# Patient Record
Sex: Female | Born: 1955 | Race: White | Hispanic: No | Marital: Married | Smoking: Never smoker
Health system: Southern US, Community
[De-identification: ages and names within clinical notes are randomized; demographics above are authoritative.]

## PROBLEM LIST (undated history)

## (undated) DIAGNOSIS — E079 Disorder of thyroid, unspecified: Secondary | ICD-10-CM

## (undated) HISTORY — DX: Disorder of thyroid, unspecified: E07.9

## (undated) HISTORY — PX: POLYPECTOMY: SHX149

---

## 1997-09-01 ENCOUNTER — Inpatient Hospital Stay (HOSPITAL_COMMUNITY): Admission: AD | Admit: 1997-09-01 | Discharge: 1997-09-01 | Payer: Self-pay | Admitting: Obstetrics & Gynecology

## 1997-09-19 ENCOUNTER — Encounter (HOSPITAL_COMMUNITY): Admission: RE | Admit: 1997-09-19 | Discharge: 1997-12-18 | Payer: Self-pay | Admitting: *Deleted

## 1997-10-14 ENCOUNTER — Encounter: Payer: Self-pay | Admitting: *Deleted

## 1997-11-24 ENCOUNTER — Observation Stay (HOSPITAL_COMMUNITY): Admission: AD | Admit: 1997-11-24 | Discharge: 1997-11-25 | Payer: Self-pay | Admitting: Obstetrics & Gynecology

## 1997-11-24 ENCOUNTER — Encounter: Payer: Self-pay | Admitting: Emergency Medicine

## 1997-11-24 ENCOUNTER — Emergency Department (HOSPITAL_COMMUNITY): Admission: EM | Admit: 1997-11-24 | Discharge: 1997-11-24 | Payer: Self-pay | Admitting: Emergency Medicine

## 1997-11-25 ENCOUNTER — Encounter: Payer: Self-pay | Admitting: Obstetrics & Gynecology

## 1997-12-23 ENCOUNTER — Encounter: Payer: Self-pay | Admitting: *Deleted

## 1997-12-23 ENCOUNTER — Encounter (HOSPITAL_COMMUNITY): Admission: RE | Admit: 1997-12-23 | Discharge: 1998-03-17 | Payer: Self-pay | Admitting: *Deleted

## 1997-12-25 ENCOUNTER — Ambulatory Visit (HOSPITAL_COMMUNITY): Admission: RE | Admit: 1997-12-25 | Discharge: 1997-12-25 | Payer: Self-pay | Admitting: *Deleted

## 1997-12-26 ENCOUNTER — Inpatient Hospital Stay (HOSPITAL_COMMUNITY): Admission: AD | Admit: 1997-12-26 | Discharge: 1997-12-26 | Payer: Self-pay | Admitting: Radiology

## 1998-03-06 ENCOUNTER — Encounter: Payer: Self-pay | Admitting: *Deleted

## 1998-03-17 ENCOUNTER — Inpatient Hospital Stay (HOSPITAL_COMMUNITY): Admission: AD | Admit: 1998-03-17 | Discharge: 1998-03-20 | Payer: Self-pay | Admitting: *Deleted

## 1998-03-20 ENCOUNTER — Encounter (HOSPITAL_COMMUNITY): Admission: RE | Admit: 1998-03-20 | Discharge: 1998-06-18 | Payer: Self-pay | Admitting: *Deleted

## 1998-04-28 ENCOUNTER — Inpatient Hospital Stay (HOSPITAL_COMMUNITY): Admission: AD | Admit: 1998-04-28 | Discharge: 1998-04-28 | Payer: Self-pay | Admitting: *Deleted

## 1998-06-22 ENCOUNTER — Encounter (HOSPITAL_COMMUNITY): Admission: RE | Admit: 1998-06-22 | Discharge: 1998-09-20 | Payer: Self-pay | Admitting: *Deleted

## 2001-01-29 ENCOUNTER — Other Ambulatory Visit: Admission: RE | Admit: 2001-01-29 | Discharge: 2001-01-29 | Payer: Self-pay | Admitting: Internal Medicine

## 2002-05-22 ENCOUNTER — Other Ambulatory Visit: Admission: RE | Admit: 2002-05-22 | Discharge: 2002-05-22 | Payer: Self-pay | Admitting: Obstetrics & Gynecology

## 2004-02-12 ENCOUNTER — Other Ambulatory Visit: Admission: RE | Admit: 2004-02-12 | Discharge: 2004-02-12 | Payer: Self-pay | Admitting: Obstetrics & Gynecology

## 2004-05-11 ENCOUNTER — Ambulatory Visit: Payer: Self-pay | Admitting: Internal Medicine

## 2004-12-08 ENCOUNTER — Ambulatory Visit: Payer: Self-pay | Admitting: Internal Medicine

## 2005-03-16 ENCOUNTER — Other Ambulatory Visit: Admission: RE | Admit: 2005-03-16 | Discharge: 2005-03-16 | Payer: Self-pay | Admitting: Obstetrics & Gynecology

## 2005-12-30 ENCOUNTER — Ambulatory Visit: Payer: Self-pay | Admitting: Family Medicine

## 2008-11-11 ENCOUNTER — Encounter (INDEPENDENT_AMBULATORY_CARE_PROVIDER_SITE_OTHER): Payer: Self-pay | Admitting: *Deleted

## 2008-12-23 ENCOUNTER — Encounter (INDEPENDENT_AMBULATORY_CARE_PROVIDER_SITE_OTHER): Payer: Self-pay | Admitting: *Deleted

## 2008-12-29 ENCOUNTER — Ambulatory Visit: Payer: Self-pay | Admitting: Internal Medicine

## 2009-01-05 ENCOUNTER — Telehealth: Payer: Self-pay | Admitting: Internal Medicine

## 2009-01-07 ENCOUNTER — Ambulatory Visit: Payer: Self-pay | Admitting: Internal Medicine

## 2009-01-08 ENCOUNTER — Encounter: Payer: Self-pay | Admitting: Internal Medicine

## 2010-02-04 NOTE — Letter (Signed)
Summary: Patient Notice- Polyp Results  [facility]  [address] [place], [place] [address]   Phone: [phone]  Fax: [phone]        January 08, 2009 MRN: [id number]    [facility] [address] [place], [place]  [address]    Dear Karen Kitchens,  I am pleased to inform you that the colon polyps removed during your recent colonoscopy were found to be benign (no cancer detected) upon pathologic examination.  As we discussed,I recommend you have a repeat colonoscopy examination in 3 years to look for recurrent polyps, as having colon polyps increases your risk for having recurrent polyps or even colon cancer in the future.  Should you develop new or worsening symptoms of abdominal pain, bowel habit changes or bleeding from the rectum or bowels, please schedule an evaluation with either your primary care physician or with me.  Additional information/recommendations:  __ No further action with gastroenterology is needed at this time. Please      follow-up with your primary care physician for your other healthcare      needs.   Please call us if you are having persistent problems or have questions about your condition that have not been fully answered at this time.  Sincerely,  Wilhemina Bonito. Eda Keys., MD [facility]   This letter has been electronically signed by your physician.  Appended Document: Patient Notice- Polyp Results Letter mailed 01.13.11

## 2010-02-04 NOTE — Procedures (Signed)
Summary: Colonoscopy  Patient: [name] Note: All result statuses are Final unless otherwise noted.  Tests: (1) Colonoscopy (COL)   COL Colonoscopy           DONE     [facility]     [address]     [place], [place]  [address]           COLONOSCOPY PROCEDURE REPORT           PATIENT:  Morgan Parks, Morgan Parks  MR#:  [id number]     BIRTHDATE:  [birth year], 53 yrs. old  GENDER:  female           ENDOSCOPIST:  Wilhemina Bonito. Eda Keys, MD     Referred by:  .Direct / Self           PROCEDURE DATE:  01/07/2009     PROCEDURE:  Colonoscopy with snare polypectomy     ASA CLASS:  Class I     INDICATIONS:  Routine Risk Screening           MEDICATIONS:   Fentanyl 75 mcg IV, Versed 9 mg IV           DESCRIPTION OF PROCEDURE:   After the risks benefits and     alternatives of the procedure were thoroughly explained, informed     consent was obtained.  Digital rectal exam was performed and     revealed no abnormalities.   The LB CF-H180AL [id number] endoscope     was introduced through the anus and advanced to the cecum, which     was identified by both the appendix and ileocecal valve, without     limitations.Time to cecum = 6:05 min. The quality of the prep was     excellent, using MoviPrep.  The instrument was then withdrawn     (time = 17:47 min) as the colon was fully examined.     <<PROCEDUREIMAGES>>           FINDINGS:  There were multiple polyps (6) measuring between 3mm     and 5mm that were identified and removed in the mid transverse     colon (2), sigmoid colon (2), and rectum (2). Polyps were snared     without cautery. Retrieval was successful. snare polyp  This was     otherwise a normal examination of the colon.   Retroflexed views     in the rectum revealed no abnormalities.    The scope was then     withdrawn from the patient and the procedure completed.           COMPLICATIONS:  None           ENDOSCOPIC IMPRESSION:     1) Polyps (6)in the colon - Removed     2)  Otherwise normal examination           RECOMMENDATIONS:     1) Follow up colonoscopy in 3 years           ______________________________     Wilhemina Bonito. Eda Keys, MD           CC:  Varney Baas, MD;Bruce Romilda Garret, MD;The Patient           n.     Rosalie DoctorWilhemina Bonito. Eda Keys at 01/07/2009 01:13 PM           Alma Downs, [id number]  Note: An exclamation mark (!) indicates a result that was not dispersed into the flowsheet. Document  Creation Date: 01/07/2009 1:12 PM _______________________________________________________________________  (1) Order result status: Final Collection or observation date-time: 01/07/2009 13:02 Requested date-time:  Receipt date-time:  Reported date-time:  Referring Physician:   Ordering Physician: Fransico Setters [phone]) Specimen Source:  Source: Launa Grill Order Number: [id number] Lab site:   Appended Document: Colonoscopy     Procedures Next Due Date:    Colonoscopy: 01/2012

## 2010-02-04 NOTE — Progress Notes (Signed)
Summary: ? re prep   Phone Note Call from Patient Call back at Home Phone [phone]   Caller: Patient Call For: Marina Goodell Reason for Call: Talk to Nurse Summary of Call: Patient wants to speak to nurse regarding prep instructions. Initial call taken by: Tawni Levy,  January 05, 2009 10:24 AM  Follow-up for Phone Call        Spoke with patient about prep and the new times for prep because she said she moved her procedure day to wed. answered all questions from patient at this time. Follow-up by: Sherren Kerns RN,  January 05, 2009 11:56 AM

## 2011-12-14 ENCOUNTER — Ambulatory Visit
Admission: RE | Admit: 2011-12-14 | Discharge: 2011-12-14 | Disposition: A | Discharge status: Home / Self Care | Payer: BC Managed Care – PPO | Source: Ambulatory Visit | Attending: Obstetrics & Gynecology | Admitting: Obstetrics & Gynecology

## 2011-12-14 ENCOUNTER — Other Ambulatory Visit: Payer: Self-pay | Admitting: Obstetrics & Gynecology

## 2011-12-14 DIAGNOSIS — N63 Unspecified lump in unspecified breast: Secondary | ICD-10-CM

## 2011-12-14 IMAGING — MG MM DIGITAL DIAGNOSTIC UNILAT*L*
3 series · 3 of 3 positions shown · non-contrast
Comparison: [DATE], [DATE], [DATE], [DATE].

CLINICAL DATA: The patient notes a tender palpable mass within the
left breast at the 12 o'clock position 1 cm superior to the nipple.
There is no family history of breast cancer.

DIGITAL DIAGNOSTIC LEFT BREAST MAMMOGRAM WITH CAD AND LEFT BREAST
ULTRASOUND:

[L CC]
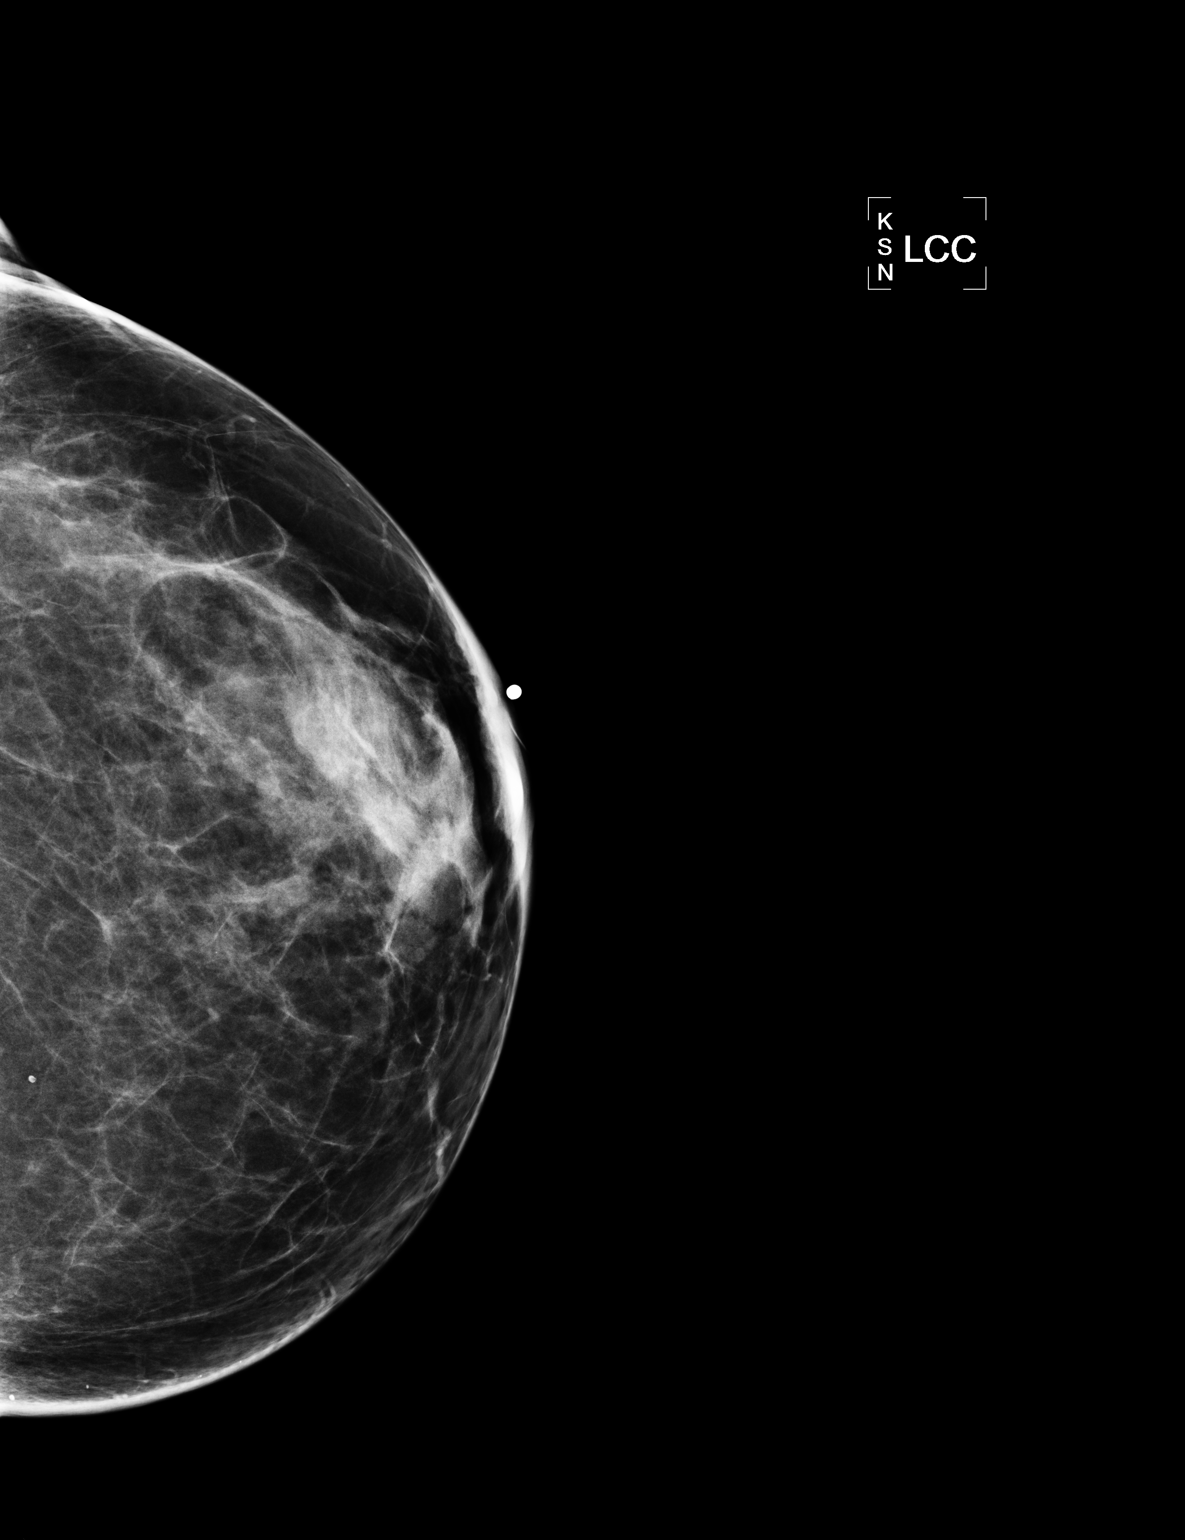

[L MLO]
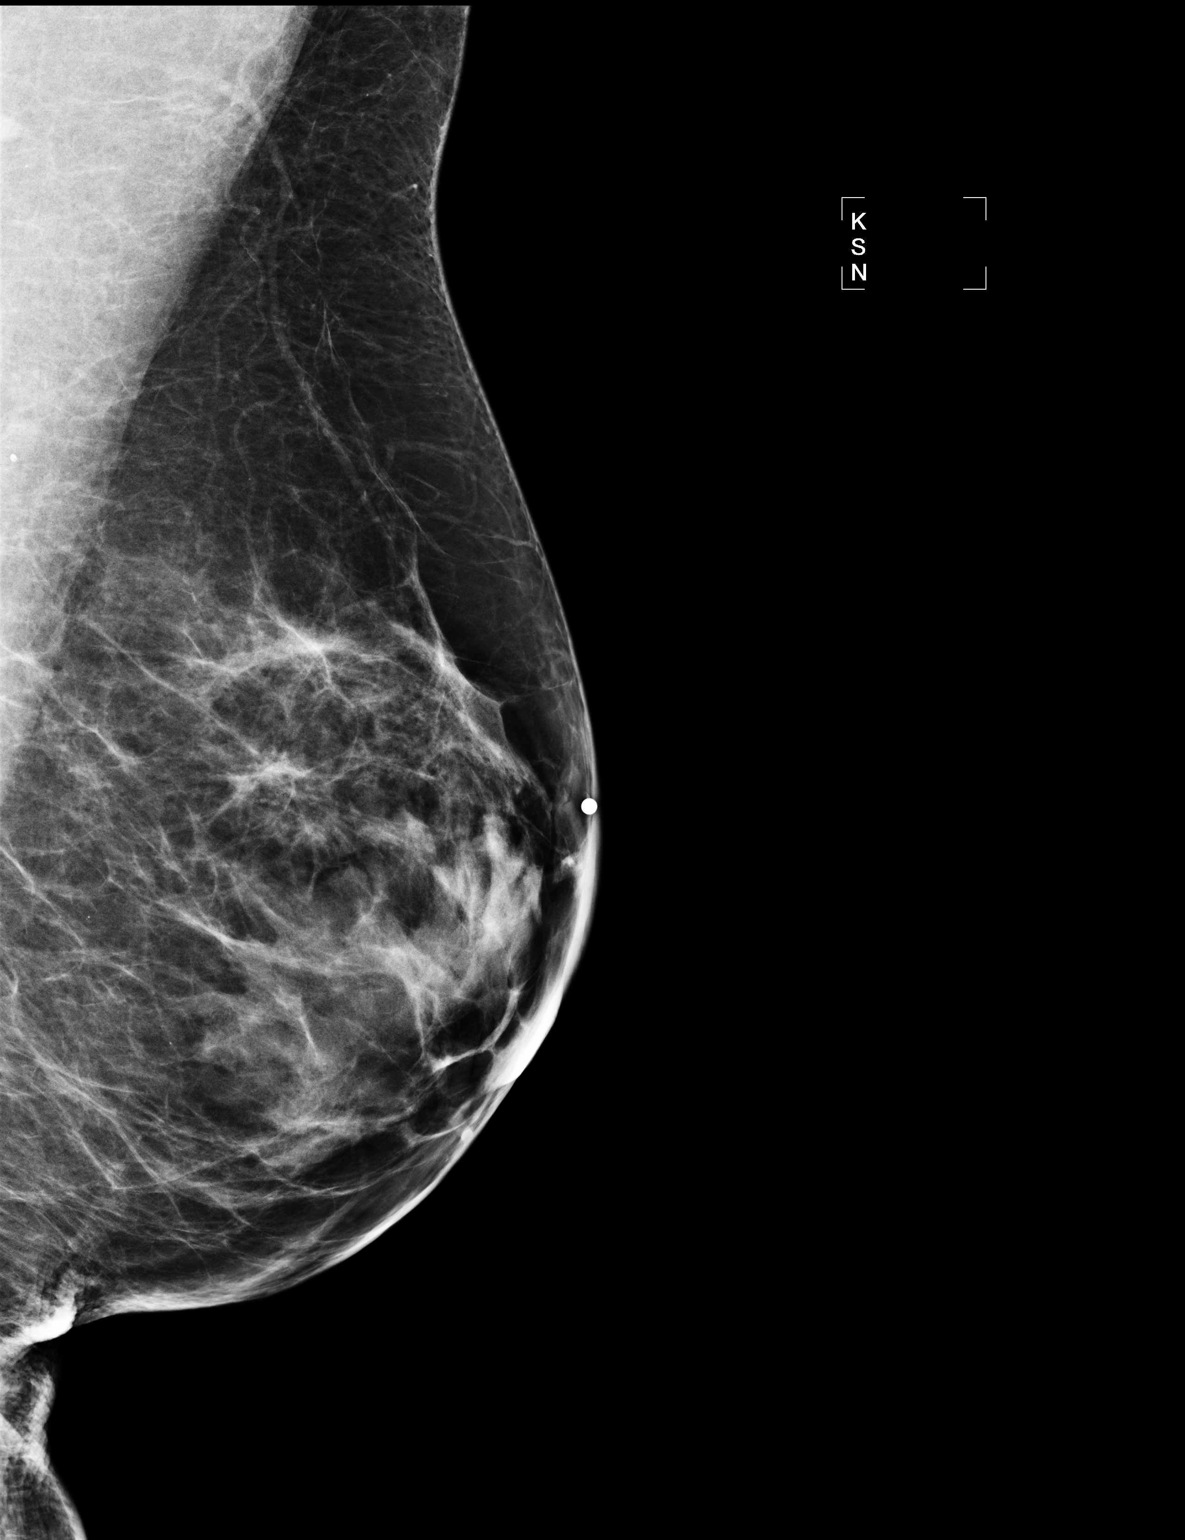

[L TAN]
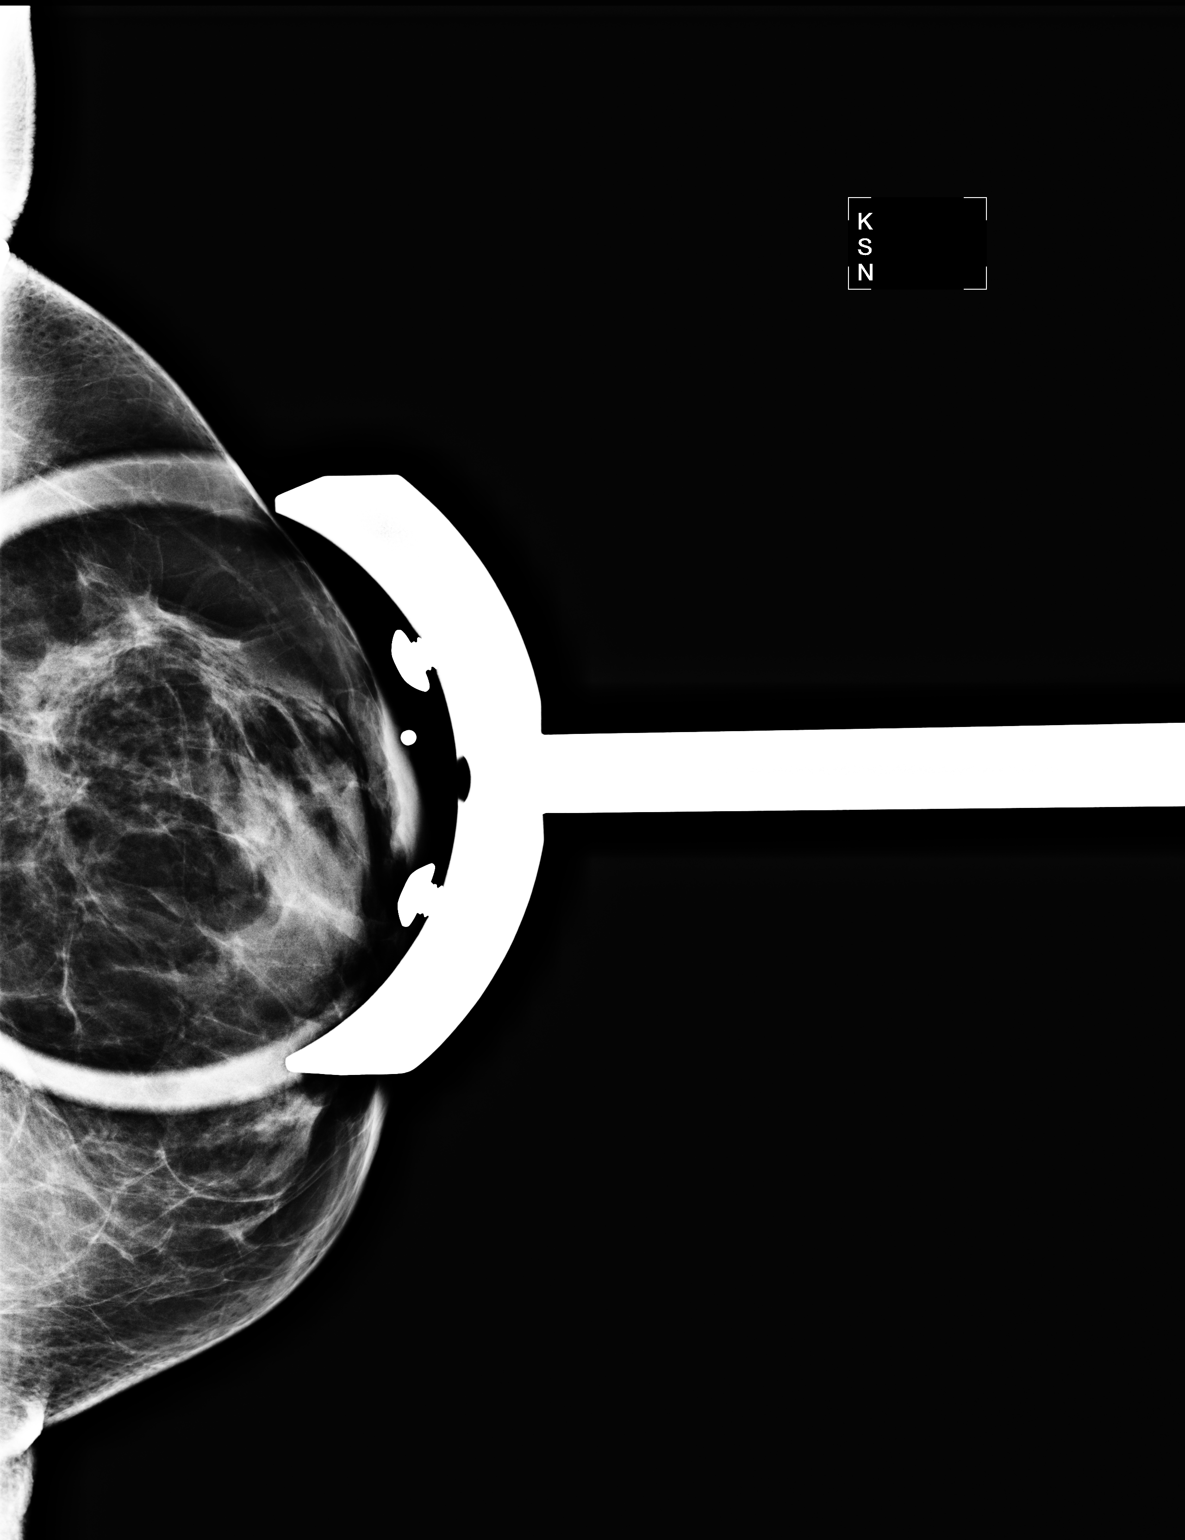

[3 of 3 positions shown; findings below may reference images not displayed]

FINDINGS: ACR Breast Density Category 3: The breast tissue is heterogeneously
dense.

There is slight thickening of the skin within the superior
periareolar portion of the left breast.  There is no mass,
distortion, or worrisome calcification.

Mammographic images were processed with CAD.

On physical exam, there is a palpable cord - like mass located
within the left breast at the 1 o'clock position 2 cm from nipple
which measures approximately 2 cm in length.  This is mildly
tender.  There are no overlying skin changes.

Ultrasound is performed, showing a sonolucent tubular structure
located in a sub dermal location within the left breast at the 1
o'clock position 2 cm from the nipple corresponding to the palpable
finding.  This is most consistent with superficial
thrombophlebitis (Mondor's disease ). There are no additional
findings.
IMPRESSION: Findings are most consistent with Mondor's  disease within the left
breast located at the 1 o'clock position (periareolar location)
accounting for the palpable finding.  Recommend ibuprofen, warm
compresses, and follow-up ultrasound evaluation in 3-4 weeks.

RECOMMENDATION:
Follow-up left breast ultrasound in 3-4 weeks.

I have discussed the findings and recommendations with the patient.
Results were also provided in writing at the conclusion of the
visit.

BI-RADS CATEGORY 3:  Probably benign finding(s) - short interval
follow-up suggested.

## 2012-01-11 ENCOUNTER — Encounter: Payer: Self-pay | Admitting: Internal Medicine

## 2012-01-11 ENCOUNTER — Ambulatory Visit
Admission: RE | Admit: 2012-01-11 | Discharge: 2012-01-11 | Disposition: A | Discharge status: Home / Self Care | Payer: BC Managed Care – PPO | Source: Ambulatory Visit | Attending: Obstetrics & Gynecology | Admitting: Obstetrics & Gynecology

## 2012-01-11 DIAGNOSIS — N63 Unspecified lump in unspecified breast: Secondary | ICD-10-CM

## 2012-03-26 ENCOUNTER — Encounter: Payer: Self-pay | Admitting: Internal Medicine

## 2012-03-26 ENCOUNTER — Other Ambulatory Visit: Payer: Self-pay

## 2012-03-26 DIAGNOSIS — Z1231 Encounter for screening mammogram for malignant neoplasm of breast: Secondary | ICD-10-CM

## 2012-05-01 ENCOUNTER — Ambulatory Visit (AMBULATORY_SURGERY_CENTER): Payer: BC Managed Care – PPO

## 2012-05-01 ENCOUNTER — Encounter: Payer: Self-pay | Admitting: Internal Medicine

## 2012-05-01 VITALS — Ht 66.0 in | Wt 130.4 lb

## 2012-05-01 DIAGNOSIS — Z86010 Personal history of colonic polyps: Secondary | ICD-10-CM

## 2012-05-01 DIAGNOSIS — Z1211 Encounter for screening for malignant neoplasm of colon: Secondary | ICD-10-CM

## 2012-05-01 DIAGNOSIS — Z8371 Family history of colonic polyps: Secondary | ICD-10-CM

## 2012-05-01 MED ORDER — MOVIPREP 100 G PO SOLR: ORAL | Status: DC

## 2012-05-09 ENCOUNTER — Ambulatory Visit
Admission: RE | Admit: 2012-05-09 | Discharge: 2012-05-09 | Disposition: A | Discharge status: Home / Self Care | Payer: BC Managed Care – PPO | Source: Ambulatory Visit

## 2012-05-09 DIAGNOSIS — Z1231 Encounter for screening mammogram for malignant neoplasm of breast: Secondary | ICD-10-CM

## 2012-05-15 ENCOUNTER — Ambulatory Visit (AMBULATORY_SURGERY_CENTER): Payer: BC Managed Care – PPO | Admitting: Internal Medicine

## 2012-05-15 ENCOUNTER — Encounter: Payer: Self-pay | Admitting: Internal Medicine

## 2012-05-15 VITALS — BP 107/62 | HR 62 | Temp 97.5°F | Resp 24 | Ht 66.0 in | Wt 130.0 lb

## 2012-05-15 DIAGNOSIS — D126 Benign neoplasm of colon, unspecified: Secondary | ICD-10-CM

## 2012-05-15 DIAGNOSIS — Z86010 Personal history of colonic polyps: Secondary | ICD-10-CM

## 2012-05-15 DIAGNOSIS — Z1211 Encounter for screening for malignant neoplasm of colon: Secondary | ICD-10-CM

## 2012-05-15 HISTORY — PX: COLONOSCOPY: SHX174

## 2012-05-15 MED ORDER — SODIUM CHLORIDE 0.9 % IV SOLN: 500.0000 mL | INTRAVENOUS | Status: DC

## 2012-05-15 NOTE — Patient Instructions (Addendum)

## 2012-05-15 NOTE — Progress Notes (Signed)
Patient did not experience any of the following events: a burn prior to discharge; a fall within the facility; wrong site/side/patient/procedure/implant event; or a hospital transfer or hospital admission upon discharge from the facility. (G8907) Patient did not have preoperative order for IV antibiotic SSI prophylaxis. (G8918)  

## 2012-05-15 NOTE — Op Note (Signed)
[facility] [address] [place], [address]   COLONOSCOPY PROCEDURE REPORT  PATIENT: Oviya, Ammar  MR#: [id number] BIRTHDATE: [birth year] , 56  yrs. old GENDER: Female ENDOSCOPIST: Roxy Cedar, MD REFERRED XB:[id number] Program Recall PROCEDURE DATE:  05/15/2012 PROCEDURE:   Colonoscopy with snare polypectomy    x 2 ASA CLASS:   Class II INDICATIONS:Patient's personal history of adenomatous colon polyps. Index 01-2009. Multiple (6) adenomas MEDICATIONS: MAC sedation, administered by CRNA and propofol (Diprivan) 350mg  IV  DESCRIPTION OF PROCEDURE:   After the risks benefits and alternatives of the procedure were thoroughly explained, informed consent was obtained.  A digital rectal exam revealed no abnormalities of the rectum.   The LB CF-H180AL [id number]  endoscope was introduced through the anus and advanced to the cecum, which was identified by both the appendix and ileocecal valve. No adverse events experienced.   The quality of the prep was excellent, using MoviPrep  The instrument was then slowly withdrawn as the colon was fully examined.      COLON FINDINGS: Two sessile polyps measuring 5 mm in size were found in the ascending colon.  A polypectomy was performed with a cold snare.  The resection was complete and the polyp tissue was completely retrieved.   Moderate diverticulosis was noted in the sigmoid colon.   The colon mucosa was otherwise normal. Retroflexed views revealed no abnormalities. The time to cecum=2 minutes 39 seconds.  Withdrawal time=18 minutes 35 seconds.  The scope was withdrawn and the procedure completed. COMPLICATIONS: There were no complications.  ENDOSCOPIC IMPRESSION: 1.   Two sessile polyps measuring 5 mm in size were found in the ascending colon; polypectomy was performed with a cold snare 2.   Moderate diverticulosis was noted in the sigmoid colon 3.   The colon mucosa was otherwise  normal  RECOMMENDATIONS: 1. Follow up colonoscopy in 5 years   eSigned:  Roxy Cedar, MD 05/15/2012 10:43 AM   cc: Varney Baas, MD and The Patient   PATIENT NAME:  Kenniyah, Sasaki MR#: [id number]

## 2012-05-15 NOTE — Progress Notes (Signed)
Called to room to assist during endoscopic procedure.  Patient ID and intended procedure confirmed with present staff. Received instructions for my participation in the procedure from the performing physician.  

## 2012-05-16 ENCOUNTER — Telehealth: Payer: Self-pay | Admitting: *Deleted

## 2012-05-16 NOTE — Telephone Encounter (Signed)
No answering machine received ,no message left on f/u callback

## 2012-05-21 ENCOUNTER — Encounter: Payer: Self-pay | Admitting: Internal Medicine

## 2014-04-29 ENCOUNTER — Other Ambulatory Visit: Payer: Self-pay | Admitting: Obstetrics & Gynecology

## 2014-04-30 LAB — CYTOLOGY - PAP

## 2014-06-13 ENCOUNTER — Encounter: Payer: Self-pay | Admitting: Internal Medicine

## 2014-11-10 ENCOUNTER — Ambulatory Visit (INDEPENDENT_AMBULATORY_CARE_PROVIDER_SITE_OTHER): Payer: BLUE CROSS/BLUE SHIELD | Admitting: Emergency Medicine

## 2014-11-10 VITALS — BP 102/64 | HR 78 | Temp 98.5°F | Resp 16 | Ht 63.0 in | Wt 129.0 lb

## 2014-11-10 DIAGNOSIS — J209 Acute bronchitis, unspecified: Secondary | ICD-10-CM

## 2014-11-10 MED ORDER — AMOXICILLIN-POT CLAVULANATE 875-125 MG PO TABS: 1.0000 | ORAL_TABLET | Freq: Two times a day (BID) | ORAL | Status: DC

## 2014-11-10 MED ORDER — HYDROCOD POLST-CPM POLST ER 10-8 MG/5ML PO SUER: 5.0000 mL | Freq: Two times a day (BID) | ORAL | Status: DC

## 2014-11-10 MED ORDER — PSEUDOEPHEDRINE-GUAIFENESIN ER 60-600 MG PO TB12: 1.0000 | ORAL_TABLET | Freq: Two times a day (BID) | ORAL | Status: DC

## 2014-11-10 NOTE — Progress Notes (Signed)
Subjective:  Patient ID: Morgan Parks, female    DOB: 1955/10/07  Age: 59 y.o. MRN: [id number]  CC: Cough; Generalized Body Aches; Headache; and chest congestion   HPI Morgan Parks presents  with postnasal drainage and sore throat. She has a cough that's productive of mucopurulent sputum. She has no wheezing or shortness breath. She has no fever or chills. She has been ill for for 3 days. Said no improvement with over-the-counter medication. She has no nausea vomiting or stool change. No rash.  History Morgan Parks has a past medical history of Thyroid disease.   She has past surgical history that includes Cesarean section.   Her  family history includes Colon polyps in her father and sister; Heart disease in her sister.  She   reports that she has never smoked. She has never used smokeless tobacco. She reports that she does not drink alcohol or use illicit drugs.  Outpatient Prescriptions Prior to Visit  Medication Sig Dispense Refill  . buPROPion (WELLBUTRIN XL) 300 MG 24 hr tablet Take 300 mg by mouth daily.    . Calcium Carbonate-Vit D-Min (CALCIUM 1200 PO) Take 1,000 mg by mouth.    . Cholecalciferol (VITAMIN D-3) 5000 UNITS TABS Take by mouth daily.    . fish oil-omega-3 fatty acids 1000 MG capsule Take 1 g by mouth daily.    Marland Kitchen levothyroxine (SYNTHROID, LEVOTHROID) 100 MCG tablet Take 100 mcg by mouth daily before breakfast.    . thyroid (ARMOUR) 120 MG tablet Take 120 mg by mouth daily.    . valACYclovir (VALTREX) 1000 MG tablet     . liothyronine (CYTOMEL) 5 MCG tablet     . magnesium oxide (MAG-OX) 400 MG tablet Take 400 mg by mouth daily.     No facility-administered medications prior to visit.    Social History   Social History  . Marital Status: Married    Spouse Name: N/A  . Number of Children: N/A  . Years of Education: N/A   Social History Main Topics  . Smoking status: Never Smoker   . Smokeless tobacco: Never Used  . Alcohol Use: No  . Drug Use:  No  . Sexual Activity: Not Asked   Other Topics Concern  . None   Social History Narrative     Review of Systems  Constitutional: Negative for fever, chills and appetite change.  HENT: Positive for postnasal drip and sore throat. Negative for congestion, ear pain and sinus pressure.   Eyes: Negative for pain and redness.  Respiratory: Positive for cough. Negative for shortness of breath and wheezing.   Cardiovascular: Negative for leg swelling.  Gastrointestinal: Negative for nausea, vomiting, abdominal pain, diarrhea, constipation and blood in stool.  Endocrine: Negative for polyuria.  Genitourinary: Negative for dysuria, urgency, frequency and flank pain.  Musculoskeletal: Negative for gait problem.  Skin: Negative for rash.  Neurological: Negative for weakness and headaches.  Psychiatric/Behavioral: Negative for confusion and decreased concentration. The patient is not nervous/anxious.     Objective:  BP 102/64 mmHg  Pulse 78  Temp(Src) 98.5 F (36.9 C) (Oral)  Resp 16  Ht 5\' 3"  (1.6 m)  Wt 129 lb (58.514 kg)  BMI 22.86 kg/m2  SpO2 97%  Physical Exam  Constitutional: She is oriented to person, place, and time. She appears well-developed and well-nourished. No distress.  HENT:  Head: Normocephalic and atraumatic.  Right Ear: External ear normal.  Left Ear: External ear normal.  Nose: Nose normal.  Eyes: Conjunctivae and  EOM are normal. Pupils are equal, round, and reactive to light. No scleral icterus.  Neck: Normal range of motion. Neck supple. No tracheal deviation present.  Cardiovascular: Normal rate, regular rhythm and normal heart sounds.   Pulmonary/Chest: Effort normal. No respiratory distress. She has no wheezes. She has no rales.  Abdominal: She exhibits no mass. There is no tenderness. There is no rebound and no guarding.  Musculoskeletal: She exhibits no edema.  Lymphadenopathy:    She has no cervical adenopathy.  Neurological: She is alert and  oriented to person, place, and time. Coordination normal.  Skin: Skin is warm and dry. No rash noted.  Psychiatric: She has a normal mood and affect. Her behavior is normal.      Assessment & Plan:   Morgan Parks was seen today for cough, generalized body aches, headache and chest congestion.  Diagnoses and all orders for this visit:  Acute bronchitis, unspecified organism  Other orders -     chlorpheniramine-HYDROcodone (TUSSIONEX PENNKINETIC ER) 10-8 MG/5ML SUER; Take 5 mLs by mouth 2 (two) times daily. -     amoxicillin-clavulanate (AUGMENTIN) 875-125 MG tablet; Take 1 tablet by mouth 2 (two) times daily. -     pseudoephedrine-guaifenesin (MUCINEX D) 60-600 MG 12 hr tablet; Take 1 tablet by mouth every 12 (twelve) hours.   I have discontinued [name] magnesium oxide and liothyronine. I am also having her start on chlorpheniramine-HYDROcodone, amoxicillin-clavulanate, and pseudoephedrine-guaifenesin. Additionally, I am having her maintain her thyroid, Vitamin D-3, levothyroxine, buPROPion, Calcium Carbonate-Vit D-Min (CALCIUM 1200 PO), fish oil-omega-3 fatty acids, valACYclovir, and BIEST/PROGESTERONE.  Meds ordered this encounter  Medications  . Estradiol-Estriol-Progesterone (BIEST/PROGESTERONE) CREA    Sig: Place onto the skin.  . chlorpheniramine-HYDROcodone (TUSSIONEX PENNKINETIC ER) 10-8 MG/5ML SUER    Sig: Take 5 mLs by mouth 2 (two) times daily.    Dispense:  60 mL    Refill:  0  . amoxicillin-clavulanate (AUGMENTIN) 875-125 MG tablet    Sig: Take 1 tablet by mouth 2 (two) times daily.    Dispense:  20 tablet    Refill:  0  . pseudoephedrine-guaifenesin (MUCINEX D) 60-600 MG 12 hr tablet    Sig: Take 1 tablet by mouth every 12 (twelve) hours.    Dispense:  18 tablet    Refill:  0    Appropriate red flag conditions were discussed with the patient as well as actions that should be taken.  Patient expressed his understanding.  Follow-up: Return if symptoms worsen or  fail to improve.  Roselee Culver, MD

## 2014-11-10 NOTE — Patient Instructions (Signed)

## 2014-11-18 ENCOUNTER — Other Ambulatory Visit: Payer: Self-pay

## 2014-11-18 DIAGNOSIS — Z1231 Encounter for screening mammogram for malignant neoplasm of breast: Secondary | ICD-10-CM

## 2014-12-23 ENCOUNTER — Ambulatory Visit: Payer: Self-pay

## 2015-01-08 ENCOUNTER — Ambulatory Visit (INDEPENDENT_AMBULATORY_CARE_PROVIDER_SITE_OTHER): Payer: BLUE CROSS/BLUE SHIELD | Admitting: Physician Assistant

## 2015-01-08 VITALS — BP 126/80 | HR 88 | Temp 98.3°F | Resp 17 | Ht 63.5 in | Wt 130.0 lb

## 2015-01-08 DIAGNOSIS — J069 Acute upper respiratory infection, unspecified: Secondary | ICD-10-CM

## 2015-01-08 DIAGNOSIS — B9789 Other viral agents as the cause of diseases classified elsewhere: Principal | ICD-10-CM

## 2015-01-08 NOTE — Patient Instructions (Signed)
-   You have a viral upper respiratory infection, (the common cold) and it is not uncommon for symptoms to last 10 days to 2 weeks, regardless of which medications you take. Unfortunately antibiotics will not help your symptoms as they target bacteria, and you are likely suffering from a virus. Additionally, 1 in 8 people who take antibiotics suffer mild to severe side effects.    - You would benefit from high dose ibuprofen. TAKE 600-800 mg of ibuprofen every 8 hours as needed to control low grade fever, fatigue, and pain.    - I also recommend that you take Zyrtec-D 5-120 every morning or Claritin-D 10-240 for the next five to 10 days. This medication will help you with nasal congestion, post nasal drip and sneezing. You will have to purchase this directly from the pharmacist   Viral URI Medications: Please consult the pharmacist if you have questions. 600-800 mg ibuprofen every 8 hours as needed for the next five to ten days 5-120 Zyrtec-D every morning for five to 10 days OR Claritin D 10-240 every morning for five days.  Please visit the CDC's website [employer].cy to learn about appropriate and inappropriate uses of antibiotics.

## 2015-01-08 NOTE — Progress Notes (Signed)
   01/08/2015 3:50 PM   DOB: 09/02/1955 / MRN: [id number]  SUBJECTIVE:  Morgan Parks is a 60 y.o. female presenting for for the evaluation of congestion, cough and sore throat that started 4 days ago.  Associated symptoms include headache today and she denies fever, difficulty breathing and jaw pain. Treatments tried thus far include Nyquil with good  relief. She reports sick contacts.   She has No Known Allergies.   She  has a past medical history of Thyroid disease.    She  reports that she has never smoked. She has never used smokeless tobacco. She reports that she does not drink alcohol or use illicit drugs. She  has no sexual activity history on file. The patient  has past surgical history that includes Cesarean section.  Her family history includes Colon polyps in her father and sister; Heart disease in her sister.  Review of Systems  Constitutional: Positive for malaise/fatigue. Negative for fever, chills and diaphoresis.  HENT: Positive for congestion and sore throat.   Respiratory: Positive for cough. Negative for hemoptysis, shortness of breath and wheezing.   Cardiovascular: Negative for chest pain.  Gastrointestinal: Negative for nausea.  Skin: Negative for rash.  Neurological: Negative for dizziness and weakness.  Endo/Heme/Allergies: Negative for polydipsia.    Problem list and medications reviewed and updated by myself where necessary, and exist elsewhere in the encounter.   OBJECTIVE:  BP 126/80 mmHg  Pulse 88  Temp(Src) 98.3 F (36.8 C) (Oral)  Resp 17  Ht 5' 3.5" (1.613 m)  Wt 130 lb (58.968 kg)  BMI 22.66 kg/m2  SpO2 97%  Physical Exam  Constitutional: She is oriented to person, place, and time. She appears well-nourished. No distress.  HENT:  Right Ear: Hearing and tympanic membrane normal.  Left Ear: Hearing and tympanic membrane normal.  Nose: Mucosal edema present.  Mouth/Throat: Uvula is midline, oropharynx is clear and moist and mucous  membranes are normal.  Eyes: EOM are normal. Pupils are equal, round, and reactive to light.  Cardiovascular: Normal rate and regular rhythm.   Pulmonary/Chest: Breath sounds normal. She has no wheezes. She has no rales.  Abdominal: She exhibits no distension.  Neurological: She is alert and oriented to person, place, and time. No cranial nerve deficit. Gait normal.  Skin: Skin is dry. She is not diaphoretic.  Psychiatric: She has a normal mood and affect.  Vitals reviewed.   No results found for this or any previous visit (from the past 48 hour(s)).  ASSESSMENT AND PLAN  Morgan Parks was seen today for cough, uri and nasal congestion.  Diagnoses and all orders for this visit:  Viral URI with cough: Vitals and exam normal.  Anticipatory guidance provided via AVS.  RTC if symptoms change or worsen.     The patient was advised to call or return to clinic if she does not see an improvement in symptoms or to seek the care of the closest emergency department if she worsens with the above plan.   Philis Fendt, MHS, PA-C [facility] 01/08/2015 3:50 PM

## 2015-02-20 ENCOUNTER — Ambulatory Visit
Admission: RE | Admit: 2015-02-20 | Discharge: 2015-02-20 | Disposition: A | Discharge status: Home / Self Care | Payer: BLUE CROSS/BLUE SHIELD | Source: Ambulatory Visit

## 2015-02-20 DIAGNOSIS — Z1231 Encounter for screening mammogram for malignant neoplasm of breast: Secondary | ICD-10-CM

## 2017-01-10 DIAGNOSIS — M7502 Adhesive capsulitis of left shoulder: Secondary | ICD-10-CM | POA: Insufficient documentation

## 2017-03-01 ENCOUNTER — Other Ambulatory Visit: Payer: Self-pay | Admitting: Orthopedic Surgery

## 2017-03-01 DIAGNOSIS — M7502 Adhesive capsulitis of left shoulder: Secondary | ICD-10-CM

## 2017-03-06 ENCOUNTER — Ambulatory Visit
Admission: RE | Admit: 2017-03-06 | Discharge: 2017-03-06 | Disposition: A | Discharge status: Home / Self Care | Payer: BLUE CROSS/BLUE SHIELD | Source: Ambulatory Visit | Attending: Orthopedic Surgery | Admitting: Orthopedic Surgery

## 2017-03-06 DIAGNOSIS — M7502 Adhesive capsulitis of left shoulder: Secondary | ICD-10-CM

## 2017-05-10 ENCOUNTER — Encounter: Payer: Self-pay | Admitting: Internal Medicine

## 2017-05-10 DIAGNOSIS — M7522 Bicipital tendinitis, left shoulder: Secondary | ICD-10-CM | POA: Insufficient documentation

## 2017-05-11 HISTORY — PX: SHOULDER SURGERY: SHX246

## 2018-02-06 ENCOUNTER — Encounter: Payer: Self-pay | Admitting: Internal Medicine

## 2018-03-12 ENCOUNTER — Ambulatory Visit (AMBULATORY_SURGERY_CENTER): Payer: Self-pay | Admitting: *Deleted

## 2018-03-12 ENCOUNTER — Encounter: Payer: Self-pay | Admitting: Internal Medicine

## 2018-03-12 VITALS — Ht 63.0 in | Wt 138.0 lb

## 2018-03-12 DIAGNOSIS — Z86010 Personal history of colonic polyps: Secondary | ICD-10-CM

## 2018-03-12 MED ORDER — NA SULFATE-K SULFATE-MG SULF 17.5-3.13-1.6 GM/177ML PO SOLN: ORAL | 0 refills | Status: DC

## 2018-03-12 NOTE — Progress Notes (Signed)
Patient denies any allergies to eggs or soy. Patient denies any problems with anesthesia/sedation. Patient denies any oxygen use at home. Patient denies taking any diet/weight loss medications or blood thinners.  

## 2018-03-19 ENCOUNTER — Telehealth: Payer: Self-pay | Admitting: *Deleted

## 2018-03-19 NOTE — Telephone Encounter (Signed)
Covid-19 travel screening questions  Have you traveled in the last 14 days? [facility] 12 days   If yes where?  Do you now or have you had a fever in the last 14 days?  Do you have any respiratory symptoms of shortness of breath or cough now or in the last 14 days?  Do you have a medical history of Congestive Heart Failure?  Do you have a medical history of lung disease?  Do you have any family members or close contacts with diagnosed or suspected Covid-19? no

## 2018-03-20 ENCOUNTER — Other Ambulatory Visit: Payer: Self-pay

## 2018-03-20 ENCOUNTER — Ambulatory Visit (AMBULATORY_SURGERY_CENTER): Payer: BLUE CROSS/BLUE SHIELD | Admitting: Internal Medicine

## 2018-03-20 ENCOUNTER — Encounter: Payer: Self-pay | Admitting: Internal Medicine

## 2018-03-20 VITALS — BP 123/80 | HR 60 | Temp 97.8°F | Resp 15 | Ht 63.0 in | Wt 138.0 lb

## 2018-03-20 DIAGNOSIS — K635 Polyp of colon: Secondary | ICD-10-CM | POA: Diagnosis not present

## 2018-03-20 DIAGNOSIS — Z86010 Personal history of colonic polyps: Secondary | ICD-10-CM

## 2018-03-20 DIAGNOSIS — D120 Benign neoplasm of cecum: Secondary | ICD-10-CM

## 2018-03-20 MED ORDER — SODIUM CHLORIDE 0.9 % IV SOLN: 500.0000 mL | Freq: Once | INTRAVENOUS | Status: DC

## 2018-03-20 NOTE — Op Note (Signed)
[place] Patient Name: Morgan Parks Procedure Date: 03/20/2018 9:22 AM MRN: [id number] Endoscopist: Docia Chuck. Henrene Pastor , MD Age: 63 Referring MD:  Date of Birth: 10/07/1955 Gender: Female Account #: [id number] Procedure:                Colonoscopy with cold snare polypectomy x 1 Indications:              High risk colon cancer surveillance: Personal                            history of multiple (3 or more) adenomas. Previous                            examinations 2011 and 2014 Medicines:                Monitored Anesthesia Care Procedure:                Pre-Anesthesia Assessment:                           - Prior to the procedure, a History and Physical                            was performed, and patient medications and                            allergies were reviewed. The patient's tolerance of                            previous anesthesia was also reviewed. The risks                            and benefits of the procedure and the sedation                            options and risks were discussed with the patient.                            All questions were answered, and informed consent                            was obtained. Prior Anticoagulants: The patient has                            taken no previous anticoagulant or antiplatelet                            agents. ASA Grade Assessment: II - A patient with                            mild systemic disease. After reviewing the risks                            and benefits, the patient was deemed in  satisfactory condition to undergo the procedure.                           After obtaining informed consent, the colonoscope                            was passed under direct vision. Throughout the                            procedure, the patient's blood pressure, pulse, and                            oxygen saturations were monitored continuously. The                             Model PCF-[id number]) scope was introduced                            through the anus and advanced to the the cecum,                            identified by appendiceal orifice and ileocecal                            valve. The ileocecal valve, appendiceal orifice,                            and rectum were photographed. The quality of the                            bowel preparation was good. The colonoscopy was                            performed without difficulty. The patient tolerated                            the procedure well. The bowel preparation used was                            SUPREP. Scope In: 9:38:24 AM Scope Out: 9:59:14 AM Scope Withdrawal Time: 0 hours 16 minutes 52 seconds  Total Procedure Duration: 0 hours 20 minutes 50 seconds  Findings:                 A 3 mm polyp was found in the cecum. The polyp was                            sessile. The polyp was removed with a cold snare.                            Resection and retrieval were complete.                           Multiple diverticula were found in the sigmoid  colon (moderate size) and right colon (small).                           The exam was otherwise without abnormality on                            direct and retroflexion views. Complications:            No immediate complications. Estimated blood loss:                            None. Estimated Blood Loss:     Estimated blood loss: none. Impression:               - One 3 mm polyp in the cecum, removed with a cold                            snare. Resected and retrieved.                           - Diverticulosis in the sigmoid colon and in the                            right colon.                           - The examination was otherwise normal on direct                            and retroflexion views. Recommendation:           - Repeat colonoscopy in 5 years for surveillance.                           -  Patient has a contact number available for                            emergencies. The signs and symptoms of potential                            delayed complications were discussed with the                            patient. Return to normal activities tomorrow.                            Written discharge instructions were provided to the                            patient.                           - Resume previous diet.                           - Continue present medications.                           -  Await pathology results. Docia Chuck. Henrene Pastor, MD 03/20/2018 10:14:19 AM This report has been signed electronically.

## 2018-03-20 NOTE — Patient Instructions (Signed)
Handouts given for Diverticulosis and Polyps.  YOU HAD AN ENDOSCOPIC PROCEDURE TODAY AT [facility]:   Refer to the procedure report that was given to you for any specific questions about what was found during the examination.  If the procedure report does not answer your questions, please call your gastroenterologist to clarify.  If you requested that your care partner not be given the details of your procedure findings, then the procedure report has been included in a sealed envelope for you to review at your convenience later.  YOU SHOULD EXPECT: Some feelings of bloating in the abdomen. Passage of more gas than usual.  Walking can help get rid of the air that was put into your GI tract during the procedure and reduce the bloating. If you had a lower endoscopy (such as a colonoscopy or flexible sigmoidoscopy) you may notice spotting of blood in your stool or on the toilet paper. If you underwent a bowel prep for your procedure, you may not have a normal bowel movement for a few days.  Please Note:  You might notice some irritation and congestion in your nose or some drainage.  This is from the oxygen used during your procedure.  There is no need for concern and it should clear up in a day or so.   SYMPTOMS TO REPORT IMMEDIATELY:   Following lower endoscopy (colonoscopy or flexible sigmoidoscopy):  Excessive amounts of blood in the stool  Significant tenderness or worsening of abdominal pains  Swelling of the abdomen that is new, acute  Fever of 100F or higher    Black, tarry-looking stools  For urgent or emergent issues, a gastroenterologist can be reached at any hour by calling [phone].   DIET:  We do recommend a small meal at first, but then you may proceed to your regular diet.  Drink plenty of fluids but you should avoid alcoholic beverages for 24 hours.  ACTIVITY:  You should plan to take it easy for the rest of today and you should NOT DRIVE or use heavy  machinery until tomorrow (because of the sedation medicines used during the test).    FOLLOW UP: Our staff will call the number listed on your records the next business day following your procedure to check on you and address any questions or concerns that you may have regarding the information given to you following your procedure. If we do not reach you, we will leave a message.  However, if you are feeling well and you are not experiencing any problems, there is no need to return our call.  We will assume that you have returned to your regular daily activities without incident.  If any biopsies were taken you will be contacted by phone or by letter within the next 1-3 weeks.  Please call us at [phone] if you have not heard about the biopsies in 3 weeks.    SIGNATURES/CONFIDENTIALITY: You and/or your care partner have signed paperwork which will be entered into your electronic medical record.  These signatures attest to the fact that that the information above on your After Visit Summary has been reviewed and is understood.  Full responsibility of the confidentiality of this discharge information lies with you and/or your care-partner.

## 2018-03-20 NOTE — Progress Notes (Signed)
Called to room to assist during endoscopic procedure.  Patient ID and intended procedure confirmed with present staff. Received instructions for my participation in the procedure from the performing physician.  

## 2018-03-20 NOTE — Progress Notes (Signed)
Report to PACU, RN, vss, BBS= Clear.  

## 2018-03-20 NOTE — Progress Notes (Signed)
Pt's states no medical or surgical changes since previsit or office visit. 

## 2018-03-21 ENCOUNTER — Telehealth: Payer: Self-pay | Admitting: *Deleted

## 2018-03-21 ENCOUNTER — Telehealth: Payer: Self-pay

## 2018-03-21 NOTE — Telephone Encounter (Signed)
Second attempt, no answer-mailbox full and unable to leave VM.

## 2018-03-21 NOTE — Telephone Encounter (Signed)
Left message, will try again later today.

## 2018-03-23 ENCOUNTER — Encounter: Payer: Self-pay | Admitting: Internal Medicine
